# Patient Record
Sex: Male | Born: 1960 | Race: White | Hispanic: No | Marital: Single | Smoking: Never smoker
Health system: Southern US, Community
[De-identification: ages and names within clinical notes are randomized; demographics above are authoritative.]

## PROBLEM LIST (undated history)

## (undated) DIAGNOSIS — I10 Essential (primary) hypertension: Secondary | ICD-10-CM

## (undated) DIAGNOSIS — I4891 Unspecified atrial fibrillation: Secondary | ICD-10-CM

## (undated) HISTORY — PX: SHOULDER SURGERY: SHX246

---

## 2012-08-22 ENCOUNTER — Ambulatory Visit: Payer: Self-pay | Admitting: Family Medicine

## 2012-08-22 LAB — DOT URINE DIP
Blood: NEGATIVE
Glucose,UR: NEGATIVE mg/dL (ref 0–75)
Specific Gravity: 1.015 (ref 1.003–1.030)

## 2014-07-23 ENCOUNTER — Ambulatory Visit: Payer: Self-pay | Admitting: Family Medicine

## 2014-07-23 LAB — DOT URINE DIP
Blood: NEGATIVE
Glucose,UR: NEGATIVE
Protein: NEGATIVE
Specific Gravity: 1.02 (ref 1.000–1.030)

## 2015-03-17 ENCOUNTER — Encounter: Payer: Self-pay | Admitting: *Deleted

## 2015-03-17 ENCOUNTER — Emergency Department
Admission: EM | Admit: 2015-03-17 | Discharge: 2015-03-18 | Disposition: A | Discharge status: Other Acute Inpatient Hospital | Payer: Non-veteran care | Attending: Emergency Medicine | Admitting: Emergency Medicine

## 2015-03-17 DIAGNOSIS — M545 Low back pain, unspecified: Secondary | ICD-10-CM

## 2015-03-17 DIAGNOSIS — G8929 Other chronic pain: Secondary | ICD-10-CM | POA: Insufficient documentation

## 2015-03-17 DIAGNOSIS — I10 Essential (primary) hypertension: Secondary | ICD-10-CM | POA: Insufficient documentation

## 2015-03-17 HISTORY — DX: Unspecified atrial fibrillation: I48.91

## 2015-03-17 HISTORY — DX: Essential (primary) hypertension: I10

## 2015-03-17 MED ORDER — SODIUM CHLORIDE 0.9 % IV SOLN
8.0000 mg | Freq: Once | INTRAVENOUS | Status: AC
  Administered 2015-03-17: 8 mg via INTRAVENOUS

## 2015-03-17 MED ORDER — PROMETHAZINE HCL 25 MG/ML IJ SOLN
25.0000 mg | Freq: Once | INTRAMUSCULAR | Status: AC
  Administered 2015-03-18: 25 mg via INTRAVENOUS
  Filled 2015-03-17: qty 1

## 2015-03-17 MED ORDER — DIAZEPAM 5 MG/ML IJ SOLN
2.5000 mg | Freq: Once | INTRAMUSCULAR | Status: AC
  Administered 2015-03-18: 2.5 mg via INTRAVENOUS
  Filled 2015-03-17: qty 2

## 2015-03-17 MED ORDER — FENTANYL CITRATE (PF) 100 MCG/2ML IJ SOLN
100.0000 ug | Freq: Once | INTRAMUSCULAR | Status: AC
  Administered 2015-03-18: 100 ug via INTRAVENOUS
  Filled 2015-03-17: qty 2

## 2015-03-17 MED ORDER — ONDANSETRON HCL 4 MG/2ML IJ SOLN
INTRAMUSCULAR | Status: AC
  Filled 2015-03-17: qty 4

## 2015-03-17 MED ORDER — HYDROMORPHONE HCL 1 MG/ML IJ SOLN
1.0000 mg | Freq: Once | INTRAMUSCULAR | Status: AC
  Administered 2015-03-17: 1 mg via INTRAVENOUS
  Filled 2015-03-17: qty 1

## 2015-03-17 NOTE — ED Notes (Addendum)
Pt pain is better 5/10 still c/o to lower back. Pt has dizziness still states he gets this with strong pain med's. Pt states he cant sit up or stand. Noted pt is either lying on his side or back.  Pt reports only able to pee small amount because he has trouble urinating in the bed.

## 2015-03-17 NOTE — ED Provider Notes (Signed)
[facility] Emergency Department Provider Note  Time seen: 8:46 PM  I have reviewed the triage vital signs and the nursing notes.   HISTORY  Chief Complaint Back Pain    HPI Steven Alvarez is a 54 y.o. male with a past medical history of hypertension, atrial fibrillation, chronic lower back pain presents the emergency department for back pain. According to the patient he suffered a lower back injury while serving in the [employer]. Patient states it is been 5 years since he exacerbated his back pain requiring an emergency department visit. He states approximately one week ago he bent down to lift something up and had acute onset of lower back pain, back spasm, and has had trouble ambulating. He went to the [facility] one week ago and was given a short course of hydrocodone and started on a prednisone taper. Patient states his back pain has worsened since he rolled over in bed this morning, and is now having trouble sitting or standing. He went back to the [facility] this morning and was given a shot of Toradol and discharged home stating that he did not meet admission requirements. Patient states since going home he has been unable to move, his pain is worsened, states he cannot walk or get up, and he lives alone. Patient is here with his mother. Patient was prescribed Valium which she has not taken per patient. Patient presents to [facility] for continued back pain, unable to ambulate at home due to the discomfort. Denies any incontinence issue is. Denies any focal weakness or numbness. He states it feels like his entire back is in a spasm and he cannot move. Describes his back pain is 10/10, spasm-like pain. Much worse with any type of movement.    Past Medical History  Diagnosis Date  . Hypertension   . Atrial fibrillation     There are no active problems to display for this patient.   Past Surgical History  Procedure Laterality Date  .  Shoulder surgery      No current outpatient prescriptions on file.  Allergies Keflex and Vancomycin  No family history on file.  Social History Social History  Substance Use Topics  . Smoking status: None  . Smokeless tobacco: None  . Alcohol Use: None    Review of Systems Constitutional: Negative for fever. Cardiovascular: Negative for chest pain. Respiratory: Negative for shortness of breath. Gastrointestinal: Negative for abdominal pain Genitourinary: Negative for dysuria. Negative for incontinence.  Musculoskeletal: Positive for severe lower back pain. Neurological: Negative for headaches, focal weakness or numbness. 10-point ROS otherwise negative.  ____________________________________________   PHYSICAL EXAM:  VITAL SIGNS: ED Triage Vitals  Enc Vitals Group     BP 03/17/15 1754 140/75 mmHg     Pulse Rate 03/17/15 1754 68     Resp --      Temp 03/17/15 1754 98.5 F (36.9 C)     Temp Source 03/17/15 1754 Oral     SpO2 03/17/15 1754 93 %     Weight 03/17/15 1754 250 lb (113.399 kg)     Height 03/17/15 1754 6' (1.829 m)     Head Cir --      Peak Flow --      Pain Score 03/17/15 1755 8     Pain Loc --      Pain Edu? --      Excl. in GC? --     Constitutional: Alert and oriented. Moderate distress due to  pain, lying on his side. Eyes: Normal exam ENT   Head: Normocephalic and atraumatic. Cardiovascular: Normal rate, regular rhythm. No murmur Respiratory: Normal respiratory effort without tachypnea nor retractions. Breath sounds are clear and equal bilaterally. No wheezes/rales/rhonchi. Gastrointestinal: Soft and nontender. No distention.   Musculoskeletal: Significant tenderness palpation of his lumbar spine especially over the lower lumbar spine. Patient also tender to paraspinal areas in the lumbar spine. Neurovascularly intact distally. Patient and pain with attempted movement. Neurologic:  Normal speech and language. No gross focal neurologic  deficits are appreciated. Speech is normal.  Skin:  Skin is warm, dry and intact.  Psychiatric: Mood and affect are normal. Speech and behavior are normal  ____________________________________________   INITIAL IMPRESSION / ASSESSMENT AND PLAN / ED COURSE  Pertinent labs & imaging results that were available during my care of the patient were reviewed by me and considered in my medical decision making (see chart for details).  Patient with significant back pain now on his second ER visit of the day. Denies any traumatic injury. Denies incontinence. He states it has been progressively worsening over the past one week since he bent over to pick something up and felt sharp pain in his back. States he has a history of lower back pain. We will treat the pain with IV Dilaudid, to attempt to obtain pain management. The patient is currently on a prednisone taper. He has Flexeril for home use chronically. We will closely monitor in the emergency department. At this point as he denies any incontinence, or traumatic injury, I do not feel that additional imaging would be of benefit to the patient.  After pain medication patient now vomiting, requiring IV Zofran. Patient continues to say 10/10 pain unable to roll over in bed, sit up or get out of bed. Patient states he cannot go home since he cannot stand up and he lives alone. Patient has full VA benefits, he wishes to go to the [place] if they will admit him. Patient was seen in the [place] this morning and sent home, but states since then his back has been spasming and he can no longer stand or walk. I have faxed the patient's paperwork over to the [facility] for consideration for admission per patient's wishes. Currently awaiting VA decision.  ____________________________________________   FINAL CLINICAL IMPRESSION(S) / ED DIAGNOSES  Lower back pain   Minna Antis, MD 03/17/15 2154

## 2015-03-17 NOTE — ED Notes (Addendum)
Pt was seen at [facility] this morning for back pain, pt received Toradol and valium, didn't reallt help per pt, pt returned home with increased back pain, sister called EMS to return to ER. Pt unable to stand or sit up due to severe pain 9/10. Pt slid from recliner to stretcher. Pt and sister really wanting tohim to be admitted because he cant go back home like this. MD in the room explained all the proceduresin regards to [facility] and admission for pain management.

## 2015-03-17 NOTE — ED Notes (Signed)
Pt was seen at [facility] this morning for back pain, pt received Toradol and valium, pt returned home with increased back pain, called EMS to return to ER

## 2015-03-17 NOTE — ED Provider Notes (Addendum)
[facility] with the [facility] and then with the patient himself. After Dr. Laroy Apple  said that she will be happy to see him however he was just there this morning and had a normal MRI recently. She anticipates that he probably will be discharged back home again. She wanted me to tell the patient and I did that he probably will be discharged back home again and he will have to get his own ride back home. He is however accepted to the [place]. I will try some fentanyl Phenergan and little bit of Valium to see if this improves him at all and then if not I will send him  Arnaldo Natal, MD 03/17/15 2330 After the fentanyl Phenergan and Valium patient is able tolerate sitting up. If he is able to tolerate moving more I will let him go home telling him to take his Valium as directed on given 1 dose of Valium to take early in the morning before he gets prescription filled tomorrow. He is not to take that Valium anytime before 6 AM. At the present time he is barely able to keep his eyes open and require some oxygen to maintain his saturations. We will of course not discharge him until he is able to maintain his saturations without oxygen.  Arnaldo Natal, MD 03/18/15 [phone]

## 2015-03-17 NOTE — ED Notes (Signed)
Drank 2 sips of water and vomitted MD order zofran. Pt not to have dizziness as well

## 2015-03-18 MED ORDER — LIDOCAINE 5 % EX PTCH
1.0000 | MEDICATED_PATCH | CUTANEOUS | Status: DC
  Administered 2015-03-18: 1 via TRANSDERMAL
  Filled 2015-03-18 (×2): qty 1

## 2015-03-18 MED ORDER — DIAZEPAM 5 MG PO TABS: 5.0000 mg | ORAL_TABLET | Freq: Once | ORAL | Status: DC

## 2015-03-18 NOTE — ED Notes (Signed)
Pt able to sit straight up in stretcher at this time no pain just a little uncomfortable

## 2015-03-18 NOTE — ED Notes (Signed)
Pt standing up pain 4/10 tightness to back some dizziness

## 2015-03-18 NOTE — ED Notes (Signed)
Pt states he will d/c home but wants narcotic pain meds to last him til next Tuesday when he has pcp appt. Pt doesn't want to have to [facility].

## 2015-03-18 NOTE — ED Notes (Signed)
Pt walked to the bathroom with no assist. Mild pain 6/10 but tolerable.

## 2015-03-18 NOTE — ED Notes (Signed)
MD at the bedside  

## 2015-03-18 NOTE — ED Notes (Signed)
0000 pain 7/10 still dizzy and nauseous

## 2015-03-18 NOTE — ED Notes (Addendum)
Oxygen dropped into 70s after meds placed on 2L of oxygen n/c at this time. O2 sats 94%. MD aware

## 2015-03-18 NOTE — ED Notes (Signed)
[facility] to [facility]

## 2015-03-18 NOTE — ED Provider Notes (Signed)
-----------------------------------------   3:42 AM on 03/18/2015 -----------------------------------------   Blood pressure 151/81, pulse 60, temperature 98.7 F (37.1 C), temperature source Oral, resp. rate 18, height 6' (1.829 m), weight 250 lb (113.399 kg), SpO2 100 %.  Assuming care from Dr. Darnelle Catalan.  In short, Steven Alvarez is a 54 y.o. male with a chief complaint of Back Pain .  Refer to the original H&P for additional details.  The current plan of care is to reassess the patient to determine he is able to ambulate and discharge the patient to home.  The patient received some medication from Dr. Darnelle Catalan and had some improvement in his pain. He was able to stand but reports that his pain is starting to return. The patient initially was planning to be transferred over to the [place]. The patient reports that he is still having pain and he wants to go back to the [place] to discuss his pain medication with them. I did offer the patient a Lidoderm patch to his back and discussed that Dr. Darnelle Catalan felt he needed the Valium for muscle spasm. The patient reports that he needs something for pain for a week and he wants to go to the [place] and discuss it with them. At this time the patient will be transferred to the [place] as he had been previously accepted to be taken to the ED. [facility] contacted [facility] and they did report that he can be [facility] over.   Rebecka Apley, MD 03/18/15 [phone]

## 2015-03-18 NOTE — ED Notes (Addendum)
Pt in bed resting no pain. Monitoring oxygen due to desat after ivp meds

## 2015-07-18 ENCOUNTER — Ambulatory Visit
Admission: EM | Admit: 2015-07-18 | Discharge: 2015-07-18 | Disposition: A | Discharge status: Home / Self Care | Payer: Non-veteran care | Attending: Family Medicine | Admitting: Family Medicine

## 2015-07-18 DIAGNOSIS — Z0289 Encounter for other administrative examinations: Secondary | ICD-10-CM

## 2015-07-18 LAB — DEPT OF TRANSP DIPSTICK, URINE (ARMC ONLY)
GLUCOSE, UA: NEGATIVE mg/dL
Hgb urine dipstick: NEGATIVE
PROTEIN: NEGATIVE mg/dL
Specific Gravity, Urine: 1.01 (ref 1.005–1.030)

## 2015-07-18 NOTE — ED Notes (Signed)
For DOT Physical 

## 2015-07-18 NOTE — ED Provider Notes (Signed)
SUBJECTIVE: Patient presents for DOT Physical. No acute problems. Has history of hypertension. He states that his blood pressure is usually 140/80. Blood pressure is elevated and the office today.  OBJECTIVE: VItals and Exam on form   ASSESSMENT/PLAN: Form reviewed and filled out. Form will be scanned into system. Encourage patient to follow up with primary care physician regarding blood pressure and to discuss his current medication he is on for his blood pressure. He is only given 3 month certification. Encouraged regular follow-up with PMD, dentist, and eye doctor.   Jolene ProvostKirtida Broc Caspers, MD 07/18/15 0930

## 2015-10-10 ENCOUNTER — Ambulatory Visit
Admission: EM | Admit: 2015-10-10 | Discharge: 2015-10-10 | Disposition: A | Discharge status: Home / Self Care | Payer: Non-veteran care | Attending: Family Medicine | Admitting: Family Medicine

## 2015-10-10 ENCOUNTER — Encounter: Payer: Self-pay | Admitting: *Deleted

## 2015-10-10 DIAGNOSIS — Z0289 Encounter for other administrative examinations: Secondary | ICD-10-CM

## 2015-10-10 LAB — DEPT OF TRANSP DIPSTICK, URINE (ARMC ONLY)
Glucose, UA: NEGATIVE mg/dL
HGB URINE DIPSTICK: NEGATIVE
PROTEIN: NEGATIVE mg/dL
SPECIFIC GRAVITY, URINE: 1.015 (ref 1.005–1.030)

## 2015-10-10 NOTE — ED Notes (Signed)
Patient is her for commercial drivers license physical exam.

## 2015-10-10 NOTE — ED Provider Notes (Signed)
CSN: [id number]     Arrival date & time 10/10/15  0911 History   First MD Initiated Contact with Patient 10/10/15 [phone]     Chief Complaint  Patient presents with  . Commercial Driver's License Exam   (Consider location/radiation/quality/duration/timing/severity/associated sxs/prior Treatment) HPI Comments: Patient here for DOT Physical (see scanned form)   The history is provided by the patient.    Past Medical History  Diagnosis Date  . Hypertension   . Atrial fibrillation [facility](HCC)    Past Surgical History  Procedure Laterality Date  . Shoulder surgery     Family History  Problem Relation Age of Onset  . Cancer Sister    Social History  Substance Use Topics  . Smoking status: Never Smoker   . Smokeless tobacco: Never Used  . Alcohol Use: No    Review of Systems  Allergies  Keflex and Vancomycin  Home Medications   Prior to Admission medications   Medication Sig Start Date End Date Taking? Authorizing Provider  ALPRAZolam Prudy Feeler(XANAX) 0.25 MG tablet Take 1 tablet by mouth 2 (two) times daily. 02/13/15  Yes Historical Provider, MD  carvedilol (COREG) 6.25 MG tablet Take 1 tablet by mouth 2 (two) times daily. 02/13/15  Yes Historical Provider, MD  lisinopril (PRINIVIL,ZESTRIL) 20 MG tablet Take 1 tablet by mouth daily. 02/13/15  Yes Historical Provider, MD  cyclobenzaprine (FLEXERIL) 10 MG tablet Take 1 tablet by mouth 3 (three) times daily. 02/13/15   Historical Provider, MD  HYDROcodone-acetaminophen (NORCO/VICODIN) 5-325 MG per tablet Take 1 tablet by mouth 3 (three) times daily. 03/12/15   Historical Provider, MD  Menthol-Methyl Salicylate (MUSCLE RUB EX) Apply 1 application topically as needed. 03/12/15   Historical Provider, MD  methocarbamol (ROBAXIN) 500 MG tablet Take 1 tablet by mouth 3 (three) times daily. 03/12/15   Historical Provider, MD  prazosin (MINIPRESS) 2 MG capsule Take 1 capsule by mouth daily. 02/13/15   Historical Provider, MD  predniSONE (DELTASONE) 20 MG  tablet Take 1 tablet by mouth 2 (two) times daily. 03/12/15   Historical Provider, MD   Meds Ordered and Administered this Visit  Medications - No data to display  BP 144/87 mmHg  Pulse 62  Temp(Src) 97.8 F (36.6 C)  Resp 18  Ht 5\' 11"  (1.803 m)  Wt 256 lb (116.121 kg)  BMI 35.72 kg/m2  SpO2 100% No data found.   Physical Exam  ED Course  Procedures (including critical care time)  Labs Review Labs Reviewed  DEPT OF TRANSP DIPSTICK, URINE([facility] ONLY)    Imaging Review No results found.   Visual Acuity Review  Right Eye Distance:   Left Eye Distance:   Bilateral Distance:    Right Eye Near:   Left Eye Near:    Bilateral Near:         MDM   1. Encounter for examination required by Department of Transportation (DOT)     DOT Physical (medically qualified for 1 year; see scanned form)   Payton Mccallumrlando Edana Aguado, MD 10/10/15 1155

## 2016-10-18 ENCOUNTER — Ambulatory Visit
Admission: EM | Admit: 2016-10-18 | Discharge: 2016-10-18 | Discharge status: Absent without leave | Payer: Non-veteran care

## 2016-10-19 ENCOUNTER — Encounter: Payer: Self-pay | Admitting: *Deleted

## 2016-10-19 ENCOUNTER — Ambulatory Visit
Admission: EM | Admit: 2016-10-19 | Discharge: 2016-10-19 | Disposition: A | Discharge status: Home / Self Care | Payer: Non-veteran care | Attending: Family Medicine | Admitting: Family Medicine

## 2016-10-19 DIAGNOSIS — Z0289 Encounter for other administrative examinations: Secondary | ICD-10-CM

## 2016-10-19 LAB — DEPT OF TRANSP DIPSTICK, URINE (ARMC ONLY)
Glucose, UA: NEGATIVE mg/dL
Hgb urine dipstick: NEGATIVE
Protein, ur: NEGATIVE mg/dL
Specific Gravity, Urine: 1.01 (ref 1.005–1.030)

## 2016-10-19 NOTE — ED Triage Notes (Signed)
Commercial drivers license physical exam 

## 2016-10-19 NOTE — ED Provider Notes (Signed)
CSN: [id number]     Arrival date & time 10/19/16  [id number] History   First MD Initiated Contact with Patient 10/19/16 [phone]     Chief Complaint  Patient presents with  . Commercial Driver's License Exam   (Consider location/radiation/quality/duration/timing/severity/associated sxs/prior Treatment) HPI  This 56 year old male who presents for a DOT physical. He has a history of hypertension on 2 different medications. He brings in a letter from his primary care physician stating that is under control as recently had his blood pressure medications adjusted upwards to maintain control. Patient does wear glasses when he drives. In addition he also complains of chronic low back pain for which she takes cyclobenzaprine and will also use lidocaine patches on occasion. Surgeries include  2 shoulder surgeries        Past Medical History:  Diagnosis Date  . Atrial fibrillation (HCC)   . Hypertension    Past Surgical History:  Procedure Laterality Date  . SHOULDER SURGERY     Family History  Problem Relation Age of Onset  . Cancer Sister    Social History  Substance Use Topics  . Smoking status: Never Smoker  . Smokeless tobacco: Never Used  . Alcohol use No    Review of Systems  All other systems reviewed and are negative.   Allergies  Keflex [cephalexin] and Vancomycin  Home Medications   Prior to Admission medications   Medication Sig Start Date End Date Taking? Authorizing Provider  carvedilol (COREG) 6.25 MG tablet Take 12.5 tablets by mouth 2 (two) times daily.  02/13/15  Yes Historical Provider, MD  cyclobenzaprine (FLEXERIL) 10 MG tablet Take 10 mg by mouth 2 (two) times daily.  02/13/15  Yes Historical Provider, MD  lisinopril (PRINIVIL,ZESTRIL) 20 MG tablet Take 40 mg by mouth daily.  02/13/15  Yes Historical Provider, MD  ALPRAZolam Prudy Feeler(XANAX) 0.25 MG tablet Take 1 tablet by mouth 2 (two) times daily. 02/13/15   Historical Provider, MD  HYDROcodone-acetaminophen (NORCO/VICODIN)  5-325 MG per tablet Take 1 tablet by mouth 3 (three) times daily. 03/12/15   Historical Provider, MD  Menthol-Methyl Salicylate (MUSCLE RUB EX) Apply 1 application topically as needed. 03/12/15   Historical Provider, MD  methocarbamol (ROBAXIN) 500 MG tablet Take 1 tablet by mouth 3 (three) times daily. 03/12/15   Historical Provider, MD  prazosin (MINIPRESS) 2 MG capsule Take 1 capsule by mouth daily. 02/13/15   Historical Provider, MD  predniSONE (DELTASONE) 20 MG tablet Take 1 tablet by mouth 2 (two) times daily. 03/12/15   Historical Provider, MD   Meds Ordered and Administered this Visit  Medications - No data to display  Pulse 69   Temp 99 F (37.2 C) (Oral)   Resp 15   Ht 5\' 11"  (1.803 m)   Wt 259 lb (117.5 kg)   SpO2 98%   BMI 36.12 kg/m  No data found.   Physical Exam  Constitutional:  Referred to DOT physical sheet  Nursing note and vitals reviewed.   Urgent Care Course     Procedures (including critical care time)  Labs Review Labs Reviewed  DEPT OF TRANSP DIPSTICK, URINE([facility] ONLY)    Imaging Review No results found.   Visual Acuity Review  Right Eye Distance:   Left Eye Distance:   Bilateral Distance:    Right Eye Near:   Left Eye Near:    Bilateral Near:         MDM   1. Encounter for examination required by Department of Transportation (DOT)  Patient qualifies for a one-year certificate based on his PCPs letter that his BP is under control. He did have higher pressures in the office today from white coat hypertension.    Lutricia Feil, PA-C 10/19/16 [phone]

## 2020-04-23 ENCOUNTER — Other Ambulatory Visit: Payer: Self-pay

## 2020-04-23 ENCOUNTER — Emergency Department (HOSPITAL_COMMUNITY): Payer: No Typology Code available for payment source

## 2020-04-23 ENCOUNTER — Emergency Department (HOSPITAL_COMMUNITY)
Admission: EM | Admit: 2020-04-23 | Discharge: 2020-04-23 | Disposition: A | Discharge status: Home / Self Care | Payer: No Typology Code available for payment source | Attending: Emergency Medicine | Admitting: Emergency Medicine

## 2020-04-23 ENCOUNTER — Encounter (HOSPITAL_COMMUNITY): Payer: Self-pay | Admitting: Emergency Medicine

## 2020-04-23 DIAGNOSIS — N2889 Other specified disorders of kidney and ureter: Secondary | ICD-10-CM | POA: Diagnosis not present

## 2020-04-23 DIAGNOSIS — I10 Essential (primary) hypertension: Secondary | ICD-10-CM | POA: Diagnosis not present

## 2020-04-23 DIAGNOSIS — R109 Unspecified abdominal pain: Secondary | ICD-10-CM | POA: Diagnosis present

## 2020-04-23 DIAGNOSIS — Z79899 Other long term (current) drug therapy: Secondary | ICD-10-CM | POA: Insufficient documentation

## 2020-04-23 DIAGNOSIS — N200 Calculus of kidney: Secondary | ICD-10-CM | POA: Diagnosis not present

## 2020-04-23 LAB — URINALYSIS, ROUTINE W REFLEX MICROSCOPIC
Bilirubin Urine: NEGATIVE
Glucose, UA: NEGATIVE mg/dL
Ketones, ur: NEGATIVE mg/dL
Leukocytes,Ua: NEGATIVE
Nitrite: NEGATIVE
Protein, ur: NEGATIVE mg/dL
RBC / HPF: >50 RBC/hpf — ABNORMAL HIGH (ref 0–5)
Specific Gravity, Urine: 1.015 (ref 1.005–1.030)
pH: 7 (ref 5.0–8.0)

## 2020-04-23 LAB — BASIC METABOLIC PANEL
Anion gap: 11 (ref 5–15)
BUN: 16 mg/dL (ref 6–20)
CO2: 22 mmol/L (ref 22–32)
Calcium: 9.1 mg/dL (ref 8.9–10.3)
Chloride: 105 mmol/L (ref 98–111)
Creatinine, Ser: 1.07 mg/dL (ref 0.61–1.24)
GFR calc Af Amer: >60 mL/min (ref >60)
GFR calc non Af Amer: >60 mL/min (ref >60)
Glucose, Bld: 126 mg/dL — ABNORMAL HIGH (ref 70–99)
Potassium: 4.1 mmol/L (ref 3.5–5.1)
Sodium: 138 mmol/L (ref 135–145)

## 2020-04-23 LAB — CBC
HCT: 43.5 % (ref 39.0–52.0)
Hemoglobin: 15.5 g/dL (ref 13.0–17.0)
MCH: 31.8 pg (ref 26.0–34.0)
MCHC: 35.6 g/dL (ref 30.0–36.0)
MCV: 89.3 fL (ref 80.0–100.0)
Platelets: 219 10*3/uL (ref 150–400)
RBC: 4.87 MIL/uL (ref 4.22–5.81)
RDW: 12.1 % (ref 11.5–15.5)
WBC: 9.3 10*3/uL (ref 4.0–10.5)
nRBC: 0 % (ref 0.0–0.2)

## 2020-04-23 MED ORDER — HYDROCODONE-ACETAMINOPHEN 5-325 MG PO TABS: 1.0000 | ORAL_TABLET | Freq: Four times a day (QID) | ORAL | 0 refills | Status: AC | PRN

## 2020-04-23 MED ORDER — SODIUM CHLORIDE 0.9 % IV BOLUS
1000.0000 mL | Freq: Once | INTRAVENOUS | Status: AC
  Administered 2020-04-23: 1000 mL via INTRAVENOUS

## 2020-04-23 MED ORDER — KETOROLAC TROMETHAMINE 30 MG/ML IJ SOLN
15.0000 mg | Freq: Once | INTRAMUSCULAR | Status: AC
  Administered 2020-04-23: 15 mg via INTRAVENOUS
  Filled 2020-04-23: qty 1

## 2020-04-23 MED ORDER — ONDANSETRON 4 MG PO TBDP: 4.0000 mg | ORAL_TABLET | Freq: Three times a day (TID) | ORAL | 0 refills | Status: AC | PRN

## 2020-04-23 MED ORDER — HYDROCODONE-ACETAMINOPHEN 5-325 MG PO TABS
2.0000 | ORAL_TABLET | Freq: Once | ORAL | Status: AC
  Administered 2020-04-23: 2 via ORAL
  Filled 2020-04-23: qty 2

## 2020-04-23 MED ORDER — KETOROLAC TROMETHAMINE 30 MG/ML IJ SOLN: 30.0000 mg | Freq: Once | INTRAMUSCULAR | Status: DC

## 2020-04-23 MED ORDER — ONDANSETRON HCL 4 MG/2ML IJ SOLN
4.0000 mg | Freq: Once | INTRAMUSCULAR | Status: AC
  Administered 2020-04-23: 4 mg via INTRAVENOUS
  Filled 2020-04-23: qty 2

## 2020-04-23 NOTE — ED Provider Notes (Signed)
[facility]-EMERGENCY DEPT Provider Note   CSN: [id number] Arrival date & time: 04/23/20  5456     History Chief Complaint  Patient presents with  . Flank Pain    Steven Alvarez is a 59 y.o. male.  Patient presents to the emergency department with a chief complaint of right flank pain.  He states the pain started today.  He describes it as sharp and stabbing.  He denies history of kidney stones.  Denies dysuria or hematuria.  Denies any fevers or chills.  Denies chest pain or shortness of breath.  States pain radiates down his right side.  He states that the pain comes in waves.  The history is provided by the patient. No language interpreter was used.       Past Medical History:  Diagnosis Date  . Atrial fibrillation (HCC)   . Hypertension     There are no problems to display for this patient.   Past Surgical History:  Procedure Laterality Date  . SHOULDER SURGERY         Family History  Problem Relation Age of Onset  . Cancer Sister     Social History   Tobacco Use  . Smoking status: Never Smoker  . Smokeless tobacco: Never Used  Substance Use Topics  . Alcohol use: No  . Drug use: No    Home Medications Prior to Admission medications   Medication Sig Start Date End Date Taking? Authorizing Provider  ALPRAZolam Prudy Feeler) 0.25 MG tablet Take 1 tablet by mouth 2 (two) times daily. 02/13/15   [provider]  carvedilol (COREG) 6.25 MG tablet Take 12.5 tablets by mouth 2 (two) times daily.  02/13/15   [provider]  cyclobenzaprine (FLEXERIL) 10 MG tablet Take 10 mg by mouth 2 (two) times daily.  02/13/15   [provider]  HYDROcodone-acetaminophen (NORCO/VICODIN) 5-325 MG per tablet Take 1 tablet by mouth 3 (three) times daily. 03/12/15   [provider]  lisinopril (PRINIVIL,ZESTRIL) 20 MG tablet Take 40 mg by mouth daily.  02/13/15   [provider]  Menthol-Methyl Salicylate (MUSCLE RUB  EX) Apply 1 application topically as needed. 03/12/15   [provider]  methocarbamol (ROBAXIN) 500 MG tablet Take 1 tablet by mouth 3 (three) times daily. 03/12/15   [provider]  prazosin (MINIPRESS) 2 MG capsule Take 1 capsule by mouth daily. 02/13/15   [provider]  predniSONE (DELTASONE) 20 MG tablet Take 1 tablet by mouth 2 (two) times daily. 03/12/15   [provider]    Allergies    Keflex [cephalexin] and Vancomycin  Review of Systems   Review of Systems  All other systems reviewed and are negative.   Physical Exam Updated Vital Signs BP 134/75 (BP Location: Left Arm)   Pulse (!) 57   Temp 98.2 F (36.8 C) (Oral)   Resp 17   Ht 6' (1.829 m)   Wt 117.9 kg   SpO2 96%   BMI 35.26 kg/m   Physical Exam Vitals and nursing note reviewed.  Constitutional:      Appearance: He is well-developed.  HENT:     Head: Normocephalic and atraumatic.  Eyes:     Conjunctiva/sclera: Conjunctivae normal.  Cardiovascular:     Rate and Rhythm: Normal rate and regular rhythm.     Heart sounds: No murmur heard.   Pulmonary:     Effort: Pulmonary effort is normal. No respiratory distress.     Breath sounds: Normal  breath sounds.  Abdominal:     Palpations: Abdomen is soft.     Tenderness: There is no abdominal tenderness. There is right CVA tenderness.  Musculoskeletal:     Cervical back: Neck supple.  Skin:    General: Skin is warm and dry.  Neurological:     Mental Status: He is alert and oriented to person, place, and time.  Psychiatric:        Mood and Affect: Mood normal.        Behavior: Behavior normal.     ED Results / Procedures / Treatments   Labs (all labs ordered are listed, but only abnormal results are displayed) Labs Reviewed  URINALYSIS, ROUTINE W REFLEX MICROSCOPIC - Abnormal; Notable for the following components:      Result Value   Hgb urine dipstick LARGE (*)    RBC / HPF >50 (*)    Bacteria, UA RARE (*)    All  other components within normal limits  URINE CULTURE  CBC  BASIC METABOLIC PANEL    EKG None  Radiology CT Renal Stone Study  Result Date: 04/23/2020 CLINICAL DATA:  Right flank pain.  Kidney stones suspected. EXAM: CT ABDOMEN AND PELVIS WITHOUT CONTRAST TECHNIQUE: Multidetector CT imaging of the abdomen and pelvis was performed following the standard protocol without IV contrast. COMPARISON:  None. FINDINGS: Lower chest: Lung bases are clear. Hepatobiliary: Cysts in the lateral segment left lobe of the liver. Stone in the neck of the gallbladder. No gallbladder wall thickening or edema. No bile duct dilatation. Pancreas: Unremarkable. No pancreatic ductal dilatation or surrounding inflammatory changes. Spleen: Normal in size without focal abnormality. Adrenals/Urinary Tract: No adrenal gland nodules. 3 mm stone at the right ureteropelvic junction with mild proximal hydronephrosis and stranding around the right kidney. Distal ureter is decompressed. Punctate stones demonstrated in the left kidney without hydronephrosis. There is a mass in the lower pole of the right kidney measuring 6.1 cm in diameter. Density measures are indeterminate. This is suspicious for a solid mass although hemorrhagic cysts could possibly have this appearance. Correlation with ultrasound or MRI is suggested. Bladder is decompressed. Stomach/Bowel: Stomach is within normal limits. Appendix appears normal. No evidence of bowel wall thickening, distention, or inflammatory changes. Vascular/Lymphatic: No significant vascular findings are present. No enlarged abdominal or pelvic lymph nodes. Reproductive: Prostate gland is enlarged, measuring 4.9 cm diameter. Other: No free air or free fluid in the abdomen. Abdominal wall musculature appears intact. Musculoskeletal: No acute or significant osseous findings. IMPRESSION: 1. 3 mm stone at the right ureteropelvic junction with moderate proximal obstruction. 2. Punctate nonobstructing  stones in the left kidney. 3. 6.1 cm mass in the lower pole of the right kidney is suspicious for a solid mass although hemorrhagic cysts could possibly have this appearance. Correlation with ultrasound or MRI is suggested. 4. Cholelithiasis without evidence of cholecystitis. 5. Enlarged prostate gland. Electronically Signed   By: Burman Nieves M.D.   On: 04/23/2020 04:40    Procedures Procedures (including critical care time)  Medications Ordered in ED Medications  sodium chloride 0.9 % bolus 1,000 mL (has no administration in time range)  ketorolac (TORADOL) 30 MG/ML injection 15 mg (has no administration in time range)  ondansetron (ZOFRAN) injection 4 mg (has no administration in time range)    ED Course  I have reviewed the triage vital signs and the nursing notes.  Pertinent labs & imaging results that were available during my care of the patient were reviewed by me  and considered in my medical decision making (see chart for details).    MDM Rules/Calculators/A&P                          This patient complains of right flank pain, this involves an extensive number of treatment options, and is a complaint that carries with it a high risk of complications and morbidity.    Differential Dx Kidney stone, appendicitis, cholecystitis, muscle strain, UTI  Pertinent Labs I ordered, reviewed, and interpreted labs, which included CBC notable for no significant leukocytosis, BMP notable for normal creatinine, urinalysis shows greater than 50 RBCs.  Imaging Interpretation I ordered imaging studies which included CT renal, which showed 3 mm stone at the ureteropelvic junction, questionable renal cyst, cholelithiasis without cholecystitis, and enlarged prostate.  I notified the patient of these findings, and will obtain renal ultrasound to further characterize the 6.1 cm mass.  The ultrasound was concerning for renal cell carcinoma. I notified the patient that he will need to be seen by  urology and have tissue biopsy to confirm. He will contact the [place], but I have advised him to contact our urology group if he is not able to be seen swiftly by the [place].  Medications I ordered medication Toradol for pain.  Sources Previous records obtained and reviewed no documented history of kidney stones.   Critical Interventions  None  Reassessments After the interventions stated above, I reevaluated the patient and found pain controlled.  Consultants None.  Plan Discharge with outpatient follow-up. Prescription for hydrocodone sent to pharmacy. Return precautions discussed.    Final Clinical Impression(s) / ED Diagnoses Final diagnoses:  Kidney stone  Renal mass    Rx / DC Orders ED Discharge Orders         Ordered    HYDROcodone-acetaminophen (NORCO/VICODIN) 5-325 MG tablet  Every 6 hours PRN        04/23/20 0638    ondansetron (ZOFRAN ODT) 4 MG disintegrating tablet  Every 8 hours PRN        04/23/20 0638           Roxy Horseman, PA-C 04/23/20 [id number]    Dione Booze, MD 04/23/20 2243

## 2020-04-23 NOTE — ED Triage Notes (Signed)
Patient come from home by [facility] with complaint of right flank pain.

## 2020-04-23 NOTE — Discharge Instructions (Addendum)
Your work-up today showed a small 3 mm kidney stone, please strain your urine. If you do not pass the stone in the next couple of days, please contact the urologist.  Your CT scan also showed a renal mass, which was further evaluated with an ultrasound. There are some concerning features about this mass, it could be a renal cell carcinoma (cancer), but definitive diagnosis cannot be made until a tissue sample is obtained. You will need to be evaluated by a urologist to see if this mass is cancerous. Please contact the urology group listed.  If your symptoms change or worsen, please return to the emergency department.

## 2020-04-24 LAB — URINE CULTURE: Culture: NO GROWTH

## 2021-06-25 IMAGING — CT CT RENAL STONE PROTOCOL
2 of 4 series · 16 of 46 positions shown, 18 images · non-contrast
Comparison: None.

CLINICAL DATA: Right flank pain.  Kidney stones suspected.

EXAM:
CT ABDOMEN AND PELVIS WITHOUT CONTRAST
TECHNIQUE: Multidetector CT imaging of the abdomen and pelvis was performed
following the standard protocol without IV contrast.

[Series 2: axial st · axial · 0.83mm/px · z∈[+1108,+1533]mm · 13 of 95 slices shown, 15 images]
[im 5/95  soft-tissue]
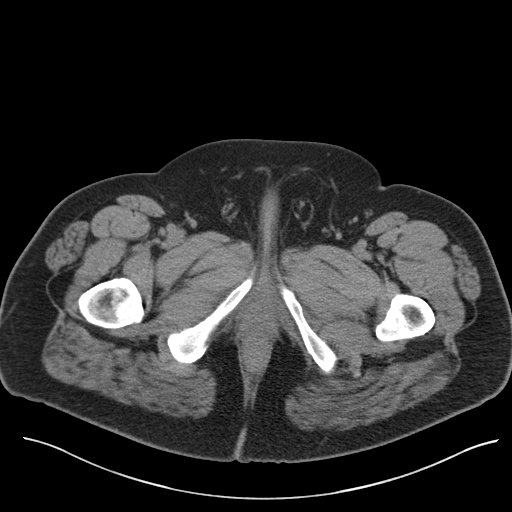
[im 5/95  bone]
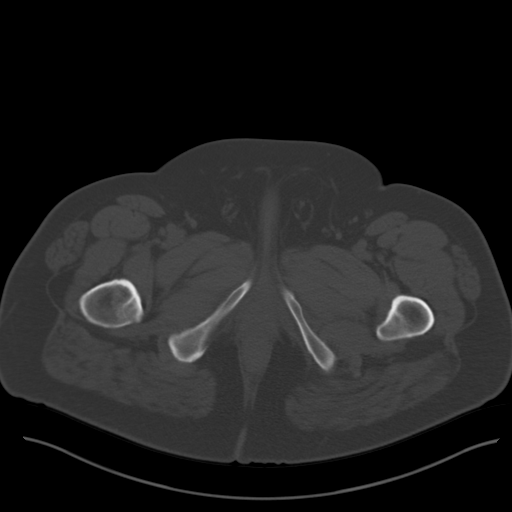
[im 14/95  soft-tissue]
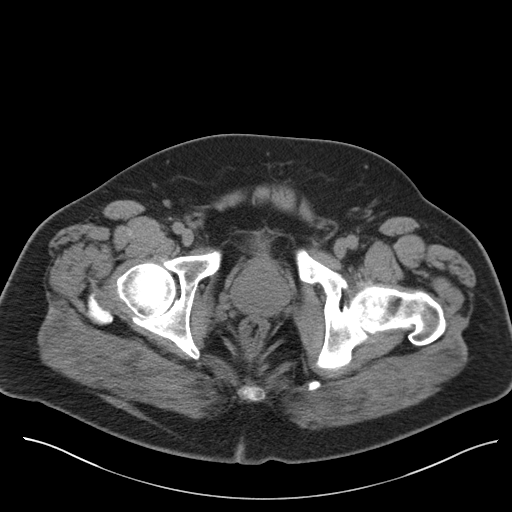
[im 18/95  soft-tissue]
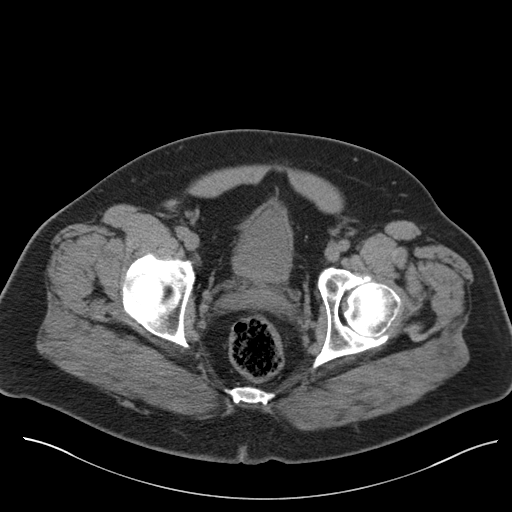
[im 27/95  soft-tissue]
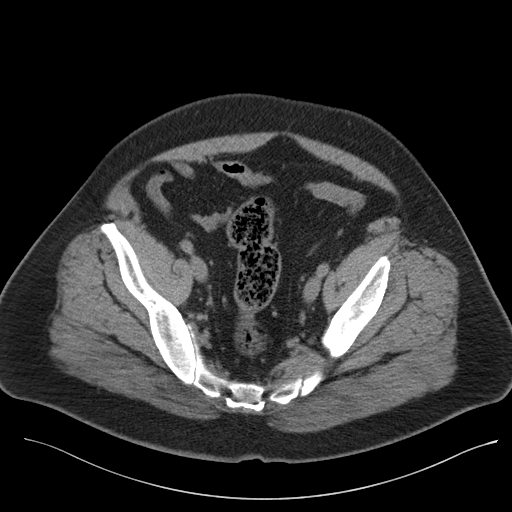
[im 32/95  soft-tissue]
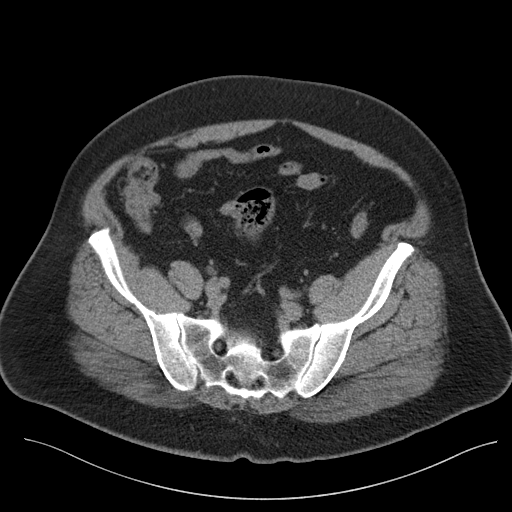
[im 41/95  soft-tissue]
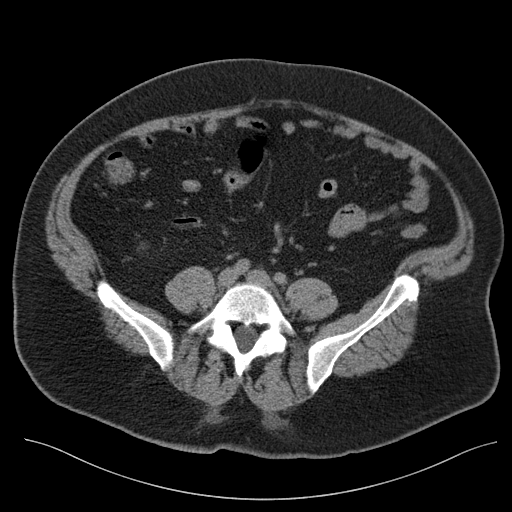
[im 50/95  soft-tissue]
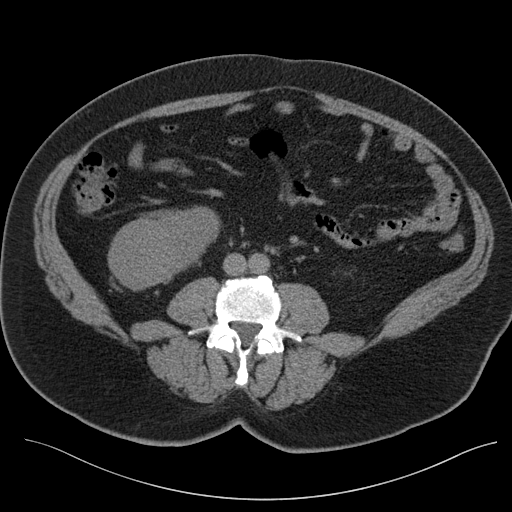
[im 54/95  soft-tissue]
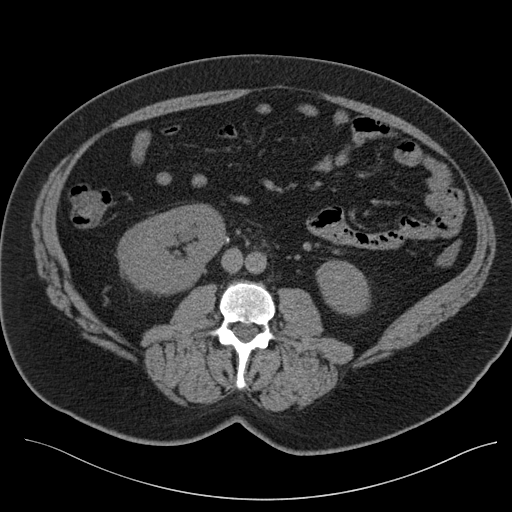
[im 63/95  soft-tissue]
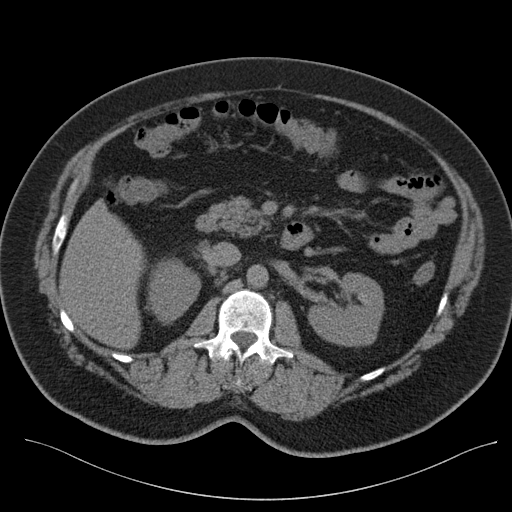
[im 63/95  bone]
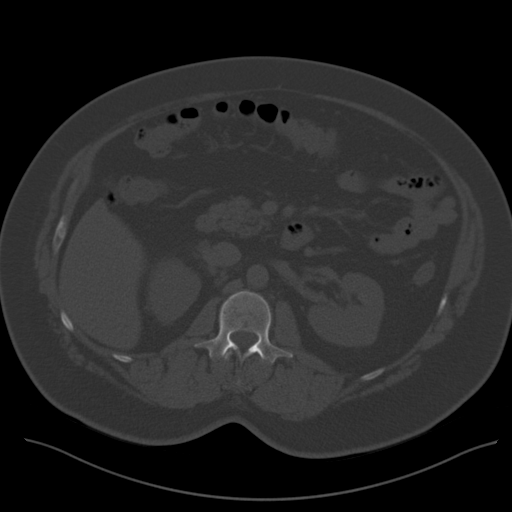
[im 68/95  soft-tissue]
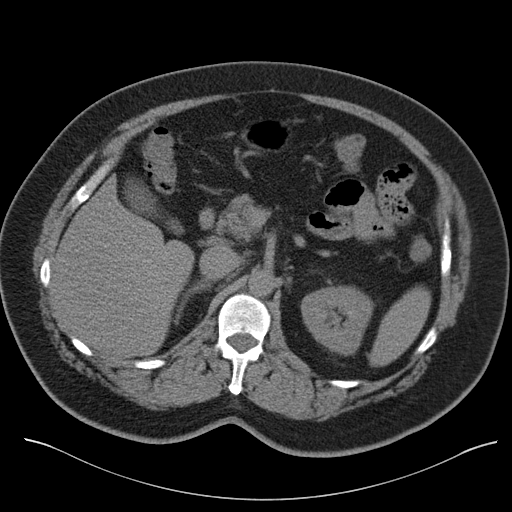
[im 77/95  soft-tissue]
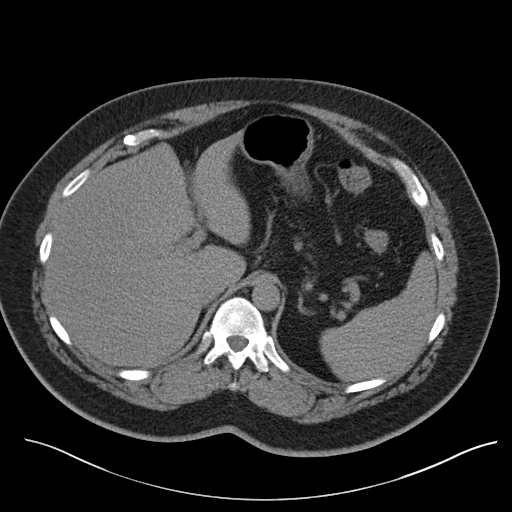
[im 81/95  soft-tissue]
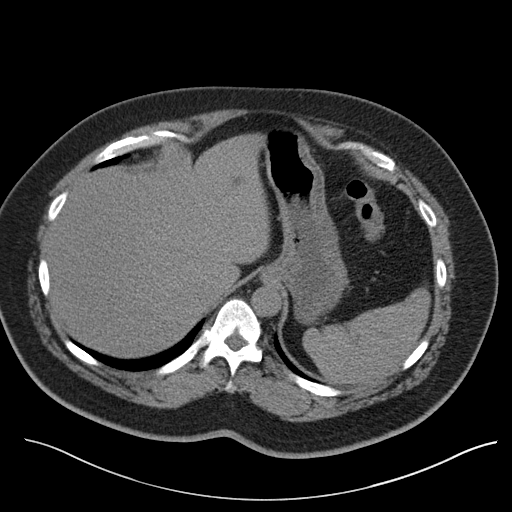
[im 90/95  soft-tissue]
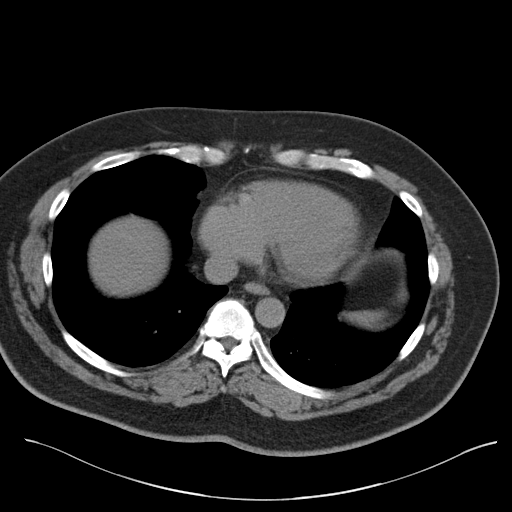

[Series 4: coronal · coronal · 0.79mm/px · 3 of 153 slices shown]
[im 51/153  soft-tissue]
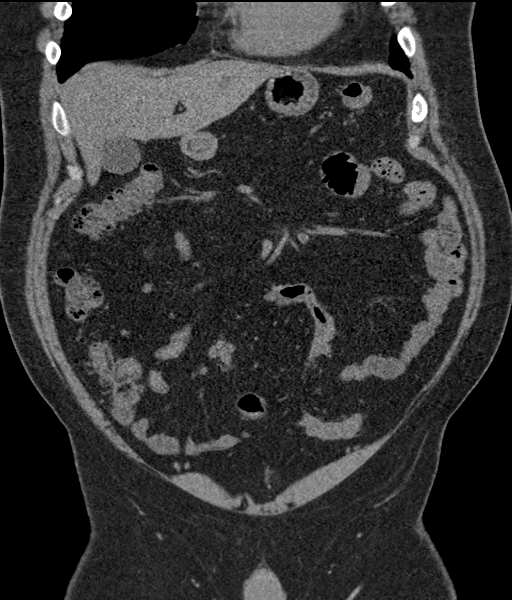
[im 68/153  soft-tissue]
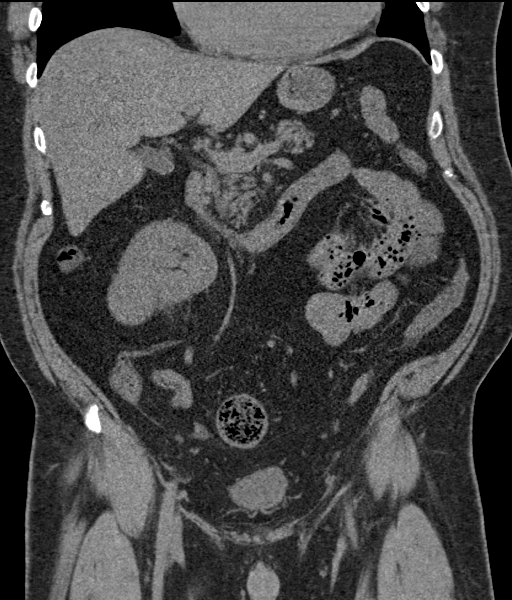
[im 85/153  soft-tissue]
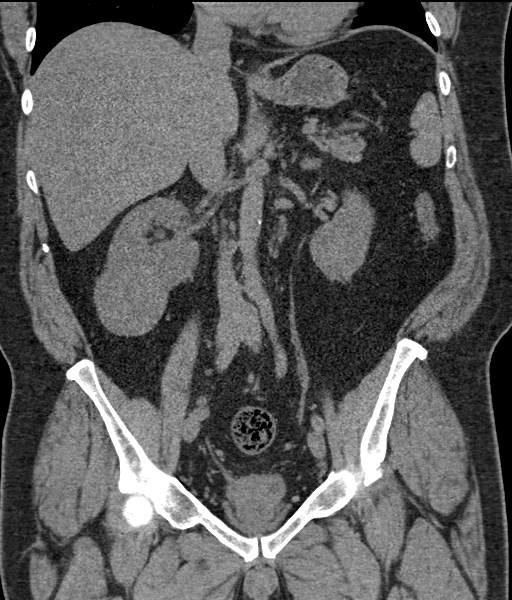

[16 of 46 positions shown; findings below may reference images not displayed]

FINDINGS: Lower chest: Lung bases are clear.

Hepatobiliary: Cysts in the lateral segment left lobe of the liver.
Stone in the neck of the gallbladder. No gallbladder wall thickening
or edema. No bile duct dilatation.

Pancreas: Unremarkable. No pancreatic ductal dilatation or
surrounding inflammatory changes.

Spleen: Normal in size without focal abnormality.

Adrenals/Urinary Tract: No adrenal gland nodules. 3 mm stone at the
right ureteropelvic junction with mild proximal hydronephrosis and
stranding around the right kidney. Distal ureter is decompressed.
Punctate stones demonstrated in the left kidney without
hydronephrosis. There is a mass in the lower pole of the right
kidney measuring 6.1 cm in diameter. Density measures are
indeterminate. This is suspicious for a solid mass although
hemorrhagic cysts could possibly have this appearance. Correlation
with ultrasound or MRI is suggested. Bladder is decompressed.

Stomach/Bowel: Stomach is within normal limits. Appendix appears
normal. No evidence of bowel wall thickening, distention, or
inflammatory changes.

Vascular/Lymphatic: No significant vascular findings are present. No
enlarged abdominal or pelvic lymph nodes.

Reproductive: Prostate gland is enlarged, measuring 4.9 cm diameter.

Other: No free air or free fluid in the abdomen. Abdominal wall
musculature appears intact.

Musculoskeletal: No acute or significant osseous findings.
IMPRESSION: 1. 3 mm stone at the right ureteropelvic junction with moderate
proximal obstruction.
2. Punctate nonobstructing stones in the left kidney.
3. 6.1 cm mass in the lower pole of the right kidney is suspicious
for a solid mass although hemorrhagic cysts could possibly have this
appearance. Correlation with ultrasound or MRI is suggested.
4. Cholelithiasis without evidence of cholecystitis.
5. Enlarged prostate gland.
